# Patient Record
Sex: Male | Born: 2000 | Race: White | Hispanic: Yes | Marital: Single | Smoking: Never smoker
Health system: Southern US, Community
[De-identification: ages and names within clinical notes are randomized; demographics above are authoritative.]

## PROBLEM LIST (undated history)

## (undated) DIAGNOSIS — Z87898 Personal history of other specified conditions: Secondary | ICD-10-CM

## (undated) DIAGNOSIS — K5900 Constipation, unspecified: Secondary | ICD-10-CM

## (undated) DIAGNOSIS — F909 Attention-deficit hyperactivity disorder, unspecified type: Secondary | ICD-10-CM

## (undated) DIAGNOSIS — E669 Obesity, unspecified: Secondary | ICD-10-CM

## (undated) DIAGNOSIS — E781 Pure hyperglyceridemia: Secondary | ICD-10-CM

## (undated) DIAGNOSIS — R159 Full incontinence of feces: Secondary | ICD-10-CM

## (undated) HISTORY — PX: ADENOIDECTOMY: SUR15

## (undated) HISTORY — PX: TYMPANOSTOMY TUBE PLACEMENT: SHX32

## (undated) HISTORY — PX: UPPER GI ENDOSCOPY: SHX6162

## (undated) HISTORY — DX: Full incontinence of feces: R15.9

## (undated) HISTORY — PX: TONSILLECTOMY AND ADENOIDECTOMY: SUR1326

---
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
## 2023-12-27 ENCOUNTER — Ambulatory Visit (INDEPENDENT_AMBULATORY_CARE_PROVIDER_SITE_OTHER): Payer: MEDICAID | Admitting: Nurse Practitioner

## 2023-12-27 ENCOUNTER — Encounter: Payer: Self-pay | Admitting: Nurse Practitioner

## 2023-12-27 ENCOUNTER — Other Ambulatory Visit (HOSPITAL_COMMUNITY): Payer: Self-pay

## 2023-12-27 VITALS — BP 126/84 | HR 97 | Temp 98.4°F | Ht 67.5 in | Wt 256.5 lb

## 2023-12-27 DIAGNOSIS — Z1329 Encounter for screening for other suspected endocrine disorder: Secondary | ICD-10-CM | POA: Diagnosis not present

## 2023-12-27 DIAGNOSIS — F88 Other disorders of psychological development: Secondary | ICD-10-CM

## 2023-12-27 DIAGNOSIS — E669 Obesity, unspecified: Secondary | ICD-10-CM | POA: Diagnosis not present

## 2023-12-27 DIAGNOSIS — Z131 Encounter for screening for diabetes mellitus: Secondary | ICD-10-CM | POA: Diagnosis not present

## 2023-12-27 DIAGNOSIS — Z8639 Personal history of other endocrine, nutritional and metabolic disease: Secondary | ICD-10-CM | POA: Diagnosis not present

## 2023-12-27 DIAGNOSIS — Z13220 Encounter for screening for lipoid disorders: Secondary | ICD-10-CM

## 2023-12-27 DIAGNOSIS — F819 Developmental disorder of scholastic skills, unspecified: Secondary | ICD-10-CM | POA: Diagnosis not present

## 2023-12-27 DIAGNOSIS — Z0000 Encounter for general adult medical examination without abnormal findings: Secondary | ICD-10-CM | POA: Diagnosis not present

## 2023-12-27 DIAGNOSIS — R0989 Other specified symptoms and signs involving the circulatory and respiratory systems: Secondary | ICD-10-CM

## 2023-12-27 DIAGNOSIS — F411 Generalized anxiety disorder: Secondary | ICD-10-CM

## 2023-12-27 LAB — CBC
HCT: 47.1 % (ref 39.0–52.0)
Hemoglobin: 16.2 g/dL (ref 13.0–17.0)
MCHC: 34.3 g/dL (ref 30.0–36.0)
MCV: 89.8 fl (ref 78.0–100.0)
Platelets: 397 10*3/uL (ref 150.0–400.0)
RBC: 5.25 Mil/uL (ref 4.22–5.81)
RDW: 13.3 % (ref 11.5–15.5)
WBC: 9.1 10*3/uL (ref 4.0–10.5)

## 2023-12-27 LAB — COMPREHENSIVE METABOLIC PANEL WITH GFR
ALT: 14 U/L (ref 0–53)
AST: 15 U/L (ref 0–37)
Albumin: 4.9 g/dL (ref 3.5–5.2)
Alkaline Phosphatase: 42 U/L (ref 39–117)
BUN: 11 mg/dL (ref 6–23)
CO2: 27 meq/L (ref 19–32)
Calcium: 9.3 mg/dL (ref 8.4–10.5)
Chloride: 104 meq/L (ref 96–112)
Creatinine, Ser: 0.91 mg/dL (ref 0.40–1.50)
GFR: 120.52 mL/min (ref >60.00)
Glucose, Bld: 98 mg/dL (ref 70–99)
Potassium: 3.9 meq/L (ref 3.5–5.1)
Sodium: 141 meq/L (ref 135–145)
Total Bilirubin: 1.1 mg/dL (ref 0.2–1.2)
Total Protein: 6.9 g/dL (ref 6.0–8.3)

## 2023-12-27 LAB — LIPID PANEL
Cholesterol: 178 mg/dL (ref 0–200)
HDL: 39.3 mg/dL (ref >39.00)
LDL Cholesterol: 110 mg/dL — ABNORMAL HIGH (ref 0–99)
NonHDL: 138.61
Total CHOL/HDL Ratio: 5
Triglycerides: 141 mg/dL (ref 0.0–149.0)
VLDL: 28.2 mg/dL (ref 0.0–40.0)

## 2023-12-27 LAB — TSH: TSH: 6.47 u[IU]/mL — ABNORMAL HIGH (ref 0.35–5.50)

## 2023-12-27 LAB — VITAMIN D 25 HYDROXY (VIT D DEFICIENCY, FRACTURES): VITD: 9.38 ng/mL — ABNORMAL LOW (ref 30.00–100.00)

## 2023-12-27 LAB — HEMOGLOBIN A1C: Hgb A1c MFr Bld: 5.2 % (ref 4.6–6.5)

## 2023-12-27 MED ORDER — OMEPRAZOLE 20 MG PO CPDR
20.0000 mg | DELAYED_RELEASE_CAPSULE | Freq: Every day | ORAL | 3 refills | Status: DC
  Filled 2023-12-27: qty 30, 30d supply, fill #0
  Filled 2024-02-04: qty 30, 30d supply, fill #1
  Filled 2024-03-17: qty 30, 30d supply, fill #2
  Filled 2024-04-27: qty 30, 30d supply, fill #3

## 2023-12-27 NOTE — Progress Notes (Signed)
 New Patient Office Visit  Subjective    Patient ID: [name] Smith, male    DOB: 2001-08-05  Age: 23 y.o. MRN: [id number]  CC:  Chief Complaint  Patient presents with   New Patient (Initial Visit)    Requests CPE. Pt accompanied by mom, [name] Smith, and requests vit D be checked. Thinks pt may need to restart rx 50,000 U wkly.     HPI [name] Smith presents to establish care  for complete physical and follow up of chronic conditions.   Migraine: no aura with it. States that it is in the front part or either side. He will not have any light or sound sensitivity. He will have nausea. He will have them 1-2 times a week.  Patient has no official diagnosis of migraine conceptions as he has headaches in his mother has a history of the same  Clearing throat: Patient's mother and patient states that patient will clear her throat quite often mostly in the morning.  Is not correlated with after meals or lying flat.  Patient denies any kind of symptoms such as runny nose, congestion, sneezing.  He has not tried anything for this at this juncture  Immunizations: -Tetanus: Completed in 2016 -Influenza: out of season  -Shingles: too young -Pneumonia: too young   Diet: Fair diet. He eats once a day and that is dinner. He will snakc sometimes wen he gets hungry.he does drink coffee water and sometimes tea or soda.  Exercise: No regular exercise.  Eye exam: PRN does not have corrective lenses Dental exam: needs updating    Colonoscopy: too young currently average risk  Lung Cancer Screening: NA  PSA: too young currently average risk   Sleep: states that he stuggles with sleep. He has trouble sleeping. He is up at night and then will sleep during the day. Does have a history of OSA with a CPAP.    History of SI: he was seen by [facility] and he no longer has those thoughts. That was during covid. States that he was in counseling.    Outpatient Encounter Medications as of 12/27/2023  Medication Sig    omeprazole (PRILOSEC) 20 MG capsule Take 1 capsule (20 mg total) by mouth daily.   [DISCONTINUED] acetaminophen  (TYLENOL ) 500 MG tablet Take 500-1,000 mg by mouth every 6 (six) hours as needed for headache.   [DISCONTINUED] ergocalciferol  (VITAMIN D2) 1.25 MG (50000 UT) capsule Take 1 capsule (50,000 Units total) by mouth once a week.   [DISCONTINUED] HYDROcodone -acetaminophen  (NORCO/VICODIN) 5-325 MG tablet Take 1-2 tablets by mouth every 6 (six) hours as needed.   No facility-administered encounter medications on file as of 12/27/2023.    Past Medical History:  Diagnosis Date   Constipation    Encopresis    H/O febrile seizure    Hypertriglyceridemia    Obesity     Past Surgical History:  Procedure Laterality Date   ADENOIDECTOMY     TONSILLECTOMY AND ADENOIDECTOMY     TYMPANOSTOMY TUBE PLACEMENT     UPPER GI ENDOSCOPY      Family History  Problem Relation Age of Onset   Migraines Mother    Obesity Mother    Learning disabilities Father    Obesity Maternal Uncle    Hypertension Maternal Uncle    Hypertension Maternal Grandmother    Breast cancer Maternal Grandmother    Alcohol abuse Maternal Grandfather    Alzheimer's disease Paternal Grandmother    Stroke Paternal Grandfather    Skin cancer Paternal Grandfather  Throat cancer Paternal Grandfather    Hypertension Paternal Grandfather    Diabetes type II Paternal Grandfather     Social History   Socioeconomic History   Marital status: Single    Spouse name: Not on file   Number of children: Not on file   Years of education: Not on file   Highest education level: 12th grade  Occupational History   Not on file  Tobacco Use   Smoking status: Never   Smokeless tobacco: Never  Vaping Use   Vaping status: Never Used  Substance and Sexual Activity   Alcohol use: Not Currently   Drug use: No   Sexual activity: Never  Other Topics Concern   Not on file  Social History Narrative   Lives with mom and older  brother.    He is in 11th grade at [employer].    He enjoys being in theatre, reading and writing.    Social Drivers of Health   Financial Resource Strain: High Risk (12/26/2023)   Overall Financial Resource Strain (CARDIA)    Difficulty of Paying Living Expenses: Very hard  Food Insecurity: Patient Declined (12/26/2023)   Hunger Vital Sign    Worried About Running Out of Food in the Last Year: Patient declined    Ran Out of Food in the Last Year: Patient declined  Transportation Needs: Unknown (12/26/2023)   PRAPARE - Administrator, Civil Service (Medical): No    Lack of Transportation (Non-Medical): Patient declined  Physical Activity: Unknown (12/26/2023)   Exercise Vital Sign    Days of Exercise per Week: 0 days    Minutes of Exercise per Session: Not on file  Stress: No Stress Concern Present (12/26/2023)   [employer] of Occupational Health - Occupational Stress Questionnaire    Feeling of Stress : Only a little  Social Connections: Socially Isolated (12/26/2023)   Social Connection and Isolation Panel [NHANES]    Frequency of Communication with Friends and Family: Once a week    Frequency of Social Gatherings with Friends and Family: Once a week    Attends Religious Services: Never    Database administrator or Organizations: No    Attends Engineer, structural: Not on file    Marital Status: Never married  Intimate Partner Violence: Not on file    Review of Systems  Constitutional:  Negative for chills and fever.  Respiratory:  Negative for shortness of breath.   Cardiovascular:  Negative for chest pain and leg swelling.  Gastrointestinal:  Negative for abdominal pain, blood in stool, constipation, diarrhea, nausea and vomiting.       BM daily to every other day  Genitourinary:  Negative for dysuria and hematuria.  Neurological:  Positive for headaches. Negative for dizziness and tingling.  Psychiatric/Behavioral:  Negative for  hallucinations and suicidal ideas.         Objective    BP 126/84   Pulse 97   Temp 98.4 F (36.9 C) (Oral)   Ht 5' 7.5" (1.715 m)   Wt 256 lb 8 oz (116.3 kg)   SpO2 98%   BMI 39.58 kg/m   Physical Exam Vitals and nursing note reviewed.  Constitutional:      Appearance: Normal appearance. He is obese.  HENT:     Right Ear: Tympanic membrane, ear canal and external ear normal.     Left Ear: Tympanic membrane, ear canal and external ear normal.     Mouth/Throat:  Mouth: Mucous membranes are moist.     Pharynx: Oropharynx is clear.  Eyes:     Extraocular Movements: Extraocular movements intact.     Pupils: Pupils are equal, round, and reactive to light.  Cardiovascular:     Rate and Rhythm: Normal rate and regular rhythm.     Pulses: Normal pulses.     Heart sounds: Normal heart sounds.  Pulmonary:     Effort: Pulmonary effort is normal.     Breath sounds: Normal breath sounds.  Abdominal:     General: Bowel sounds are normal. There is no distension.     Palpations: There is no mass.     Tenderness: There is no abdominal tenderness.     Hernia: No hernia is present.  Musculoskeletal:     Right lower leg: No edema.     Left lower leg: No edema.  Lymphadenopathy:     Cervical: No cervical adenopathy.  Skin:    General: Skin is warm.  Neurological:     General: No focal deficit present.     Mental Status: He is alert.     Deep Tendon Reflexes:     Reflex Scores:      Bicep reflexes are 2+ on the right side and 2+ on the left side.      Patellar reflexes are 2+ on the right side and 2+ on the left side.    Comments: Bilateral upper and lower extremity strength 5/5  Psychiatric:        Mood and Affect: Mood normal.        Behavior: Behavior normal.        Thought Content: Thought content normal.        Judgment: Judgment normal.         Assessment & Plan:   Problem List Items Addressed This Visit       Other   Learning disability   Per mother's  report patient was evaluated early in life but unsure of official diagnosis or results of the testing      Delayed social skills   Patient seems to be doing well      Obesity (BMI 30-39.9)   History of same pending TSH, A1c, lipid panel.  Did review and discussed medical recommendation of exercise 5 times a week 30 minutes a time.  We will set a small goal of doing 15 minutes a day 3 times a week      Preventative health care - Primary   Discussed age-appropriate immunizations and screening exams.  Did review patient's personal, surgical, social, family histories.  Patient is up-to-date on all age-appropriate vaccinations he would like.  Patient is too young for CRC screening or prostate cancer screening.  Patient was given information at discharge about preventative healthcare maintenance with anticipatory guidance.  We did set small goals for patient inclusive of getting his learner's permit and start driving with apparent over the next year and exercise goals of walking 15 minutes daily for 3 times a week.      Relevant Orders   CBC   Comprehensive metabolic panel with GFR   TSH   History of vitamin D  deficiency   Pending vitamin D  level today      Relevant Orders   VITAMIN D  25 Hydroxy (Vit-D Deficiency, Fractures)   Throat clearing   Suspect this is related to a silent reflux will trial patient on omeprazole 20 mg daily for the next 30 days.  If no benefit consider topical  steroid and antihistamine      Relevant Medications   omeprazole (PRILOSEC) 20 MG capsule   GAD (generalized anxiety disorder)   Patient does seem to have some anxiety and fear with going out and doing things.  He used to be in counseling previously that seemed to be beneficial.  Patient was lost to follow-up due to becoming uninsured now that he has insurance patient is willing to do therapy again referral placed discussed with patient and patient's mother that patient might benefit from psychiatry referral  but will let psychology assess him first      Relevant Orders   Ambulatory referral to Psychology   Other Visit Diagnoses       Screening for diabetes mellitus       Relevant Orders   Hemoglobin A1c     Screening for thyroid  disorder       Relevant Orders   TSH     Screening for lipid disorders       Relevant Orders   Lipid panel       Return in about 1 year (around 12/26/2024) for CPE and Labs.   Margarie Shay, NP

## 2023-12-27 NOTE — Assessment & Plan Note (Signed)
 History of same pending TSH, A1c, lipid panel.  Did review and discussed medical recommendation of exercise 5 times a week 30 minutes a time.  We will set a small goal of doing 15 minutes a day 3 times a week

## 2023-12-27 NOTE — Assessment & Plan Note (Signed)
 Discussed age-appropriate immunizations and screening exams.  Did review patient's personal, surgical, social, family histories.  Patient is up-to-date on all age-appropriate vaccinations he would like.  Patient is too young for CRC screening or prostate cancer screening.  Patient was given information at discharge about preventative healthcare maintenance with anticipatory guidance.  We did set small goals for patient inclusive of getting his learner's permit and start driving with apparent over the next year and exercise goals of walking 15 minutes daily for 3 times a week.

## 2023-12-27 NOTE — Patient Instructions (Signed)
 Nice to see you today I will be in touch with the labs once I have reviewed them  Follow up with me in 1 year, sooner If you need me   Goals are to get your learners permit to start driving Another goal is to get out and walk 15 mins a day 3 days a week

## 2023-12-27 NOTE — Assessment & Plan Note (Signed)
 Pending vitamin D level today.

## 2023-12-27 NOTE — Assessment & Plan Note (Signed)
 Per mother's report patient was evaluated early in life but unsure of official diagnosis or results of the testing

## 2023-12-27 NOTE — Assessment & Plan Note (Signed)
 Patient seems to be doing well

## 2023-12-27 NOTE — Assessment & Plan Note (Signed)
 Patient does seem to have some anxiety and fear with going out and doing things.  He used to be in counseling previously that seemed to be beneficial.  Patient was lost to follow-up due to becoming uninsured now that he has insurance patient is willing to do therapy again referral placed discussed with patient and patient's mother that patient might benefit from psychiatry referral but will let psychology assess him first

## 2023-12-27 NOTE — Assessment & Plan Note (Signed)
 Suspect this is related to a silent reflux will trial patient on omeprazole 20 mg daily for the next 30 days.  If no benefit consider topical steroid and antihistamine

## 2023-12-30 ENCOUNTER — Telehealth: Payer: Self-pay | Admitting: *Deleted

## 2023-12-30 NOTE — Telephone Encounter (Signed)
 Copied from CRM [id number]. Topic: Clinical - Lab/Test Results >> Dec 30, 2023  3:28 PM Deaijah H wrote: Reason for CRM: Patients mom would like to know what next steps would be due to some labs being out of normal range. Please call [phone]

## 2023-12-31 ENCOUNTER — Other Ambulatory Visit: Payer: Self-pay | Admitting: Nurse Practitioner

## 2023-12-31 ENCOUNTER — Ambulatory Visit: Payer: Self-pay | Admitting: Nurse Practitioner

## 2023-12-31 ENCOUNTER — Other Ambulatory Visit (HOSPITAL_COMMUNITY): Payer: Self-pay

## 2023-12-31 DIAGNOSIS — R7989 Other specified abnormal findings of blood chemistry: Secondary | ICD-10-CM

## 2023-12-31 DIAGNOSIS — E559 Vitamin D deficiency, unspecified: Secondary | ICD-10-CM

## 2023-12-31 DIAGNOSIS — E7800 Pure hypercholesterolemia, unspecified: Secondary | ICD-10-CM

## 2023-12-31 MED ORDER — VITAMIN D (ERGOCALCIFEROL) 1.25 MG (50000 UNIT) PO CAPS
50000.0000 [IU] | ORAL_CAPSULE | ORAL | 0 refills | Status: DC
  Filled 2023-12-31 – 2024-02-04 (×2): qty 12, 84d supply, fill #0

## 2023-12-31 NOTE — Telephone Encounter (Signed)
 Relayed lab results with [name] Smith and scheduled lab appt for pt. See other encounter

## 2024-01-07 ENCOUNTER — Other Ambulatory Visit (HOSPITAL_COMMUNITY): Payer: Self-pay

## 2024-01-10 ENCOUNTER — Other Ambulatory Visit (HOSPITAL_COMMUNITY): Payer: Self-pay

## 2024-01-10 ENCOUNTER — Telehealth: Payer: Self-pay | Admitting: Nurse Practitioner

## 2024-01-10 NOTE — Telephone Encounter (Signed)
 Left detailed voicemail for patient to call the office back.

## 2024-01-10 NOTE — Telephone Encounter (Signed)
-----   Message from [facility] sent at 12/27/2023 11:00 AM EDT ----- Regarding: throat clearing/OSA See if omeprazole  has helped and if we have gotten records in regards to patients OSA

## 2024-01-10 NOTE — Telephone Encounter (Signed)
 See if omeprazole  has helped and if we have gotten records in regards to patients OSA

## 2024-01-14 NOTE — Telephone Encounter (Signed)
 Left voicemail for patient to call the office back.

## 2024-01-14 NOTE — Telephone Encounter (Signed)
 Pt called back. Spoke with patients mother Ifeanyichukwu Wickham on [place].    [name] states that patient does not notice any difference on omeprazole .  No update on records of OSA.  [name] states she will reach out to [facility] and let us  know when we need to place an order/prescription for the medical supplies.  [name] also mentions that she will pick up Vitamin D  script and have pt start today.  No further questions or concerns.

## 2024-02-04 ENCOUNTER — Other Ambulatory Visit (HOSPITAL_COMMUNITY): Payer: Self-pay

## 2024-02-07 ENCOUNTER — Other Ambulatory Visit: Payer: MEDICAID

## 2024-02-07 DIAGNOSIS — R7989 Other specified abnormal findings of blood chemistry: Secondary | ICD-10-CM | POA: Diagnosis not present

## 2024-02-07 LAB — TSH: TSH: 2.56 u[IU]/mL (ref 0.35–5.50)

## 2024-02-07 LAB — T3, FREE: T3, Free: 4 pg/mL (ref 2.3–4.2)

## 2024-02-07 LAB — T4, FREE: Free T4: 0.78 ng/dL (ref 0.60–1.60)

## 2024-02-08 ENCOUNTER — Other Ambulatory Visit (HOSPITAL_COMMUNITY): Payer: Self-pay

## 2024-02-11 ENCOUNTER — Ambulatory Visit: Payer: Self-pay | Admitting: Nurse Practitioner

## 2024-03-17 ENCOUNTER — Other Ambulatory Visit: Payer: Self-pay | Admitting: Nurse Practitioner

## 2024-03-17 ENCOUNTER — Other Ambulatory Visit (HOSPITAL_COMMUNITY): Payer: Self-pay

## 2024-03-17 DIAGNOSIS — E559 Vitamin D deficiency, unspecified: Secondary | ICD-10-CM

## 2024-03-17 MED ORDER — VITAMIN D (ERGOCALCIFEROL) 1.25 MG (50000 UNIT) PO CAPS
50000.0000 [IU] | ORAL_CAPSULE | ORAL | 0 refills | Status: AC
  Filled 2024-03-17 – 2024-04-27 (×2): qty 12, 84d supply, fill #0

## 2024-04-27 ENCOUNTER — Other Ambulatory Visit (HOSPITAL_COMMUNITY): Payer: Self-pay

## 2024-06-29 ENCOUNTER — Other Ambulatory Visit: Payer: Self-pay | Admitting: Nurse Practitioner

## 2024-06-29 ENCOUNTER — Other Ambulatory Visit (HOSPITAL_COMMUNITY): Payer: Self-pay

## 2024-06-29 DIAGNOSIS — E559 Vitamin D deficiency, unspecified: Secondary | ICD-10-CM

## 2024-06-29 NOTE — Telephone Encounter (Signed)
 Can we get patinet in for a vitamin D  level recheck along with the Thyroid  recheck. Lab visit is fine

## 2024-06-30 ENCOUNTER — Other Ambulatory Visit (HOSPITAL_COMMUNITY): Payer: Self-pay

## 2024-07-01 ENCOUNTER — Other Ambulatory Visit: Payer: MEDICAID

## 2024-07-01 ENCOUNTER — Other Ambulatory Visit: Payer: Self-pay | Admitting: Nurse Practitioner

## 2024-07-01 DIAGNOSIS — E559 Vitamin D deficiency, unspecified: Secondary | ICD-10-CM

## 2024-07-03 ENCOUNTER — Other Ambulatory Visit (INDEPENDENT_AMBULATORY_CARE_PROVIDER_SITE_OTHER): Payer: MEDICAID

## 2024-07-03 ENCOUNTER — Other Ambulatory Visit: Payer: Self-pay | Admitting: Nurse Practitioner

## 2024-07-03 DIAGNOSIS — E559 Vitamin D deficiency, unspecified: Secondary | ICD-10-CM | POA: Diagnosis not present

## 2024-07-03 DIAGNOSIS — R7989 Other specified abnormal findings of blood chemistry: Secondary | ICD-10-CM

## 2024-07-03 NOTE — Addendum Note (Signed)
 Addended by: ISADORA RAISIN on: 07/03/2024 02:39 PM   Modules accepted: Orders

## 2024-07-04 LAB — TSH: TSH: 5.47 m[IU]/L — ABNORMAL HIGH (ref 0.40–4.50)

## 2024-07-04 LAB — VITAMIN D 25 HYDROXY (VIT D DEFICIENCY, FRACTURES): Vit D, 25-Hydroxy: 28 ng/mL — ABNORMAL LOW (ref 30–100)

## 2024-07-07 ENCOUNTER — Ambulatory Visit: Payer: Self-pay | Admitting: Nurse Practitioner

## 2024-07-07 DIAGNOSIS — Z8669 Personal history of other diseases of the nervous system and sense organs: Secondary | ICD-10-CM

## 2024-07-22 NOTE — Addendum Note (Signed)
 Addended by: WENDEE LYNWOOD HERO on: 07/22/2024 12:46 PM   Modules accepted: Orders

## 2024-07-24 ENCOUNTER — Other Ambulatory Visit: Payer: Self-pay | Admitting: Nurse Practitioner

## 2024-07-24 ENCOUNTER — Other Ambulatory Visit (HOSPITAL_COMMUNITY): Payer: Self-pay

## 2024-07-24 DIAGNOSIS — R0989 Other specified symptoms and signs involving the circulatory and respiratory systems: Secondary | ICD-10-CM

## 2024-07-28 ENCOUNTER — Other Ambulatory Visit (HOSPITAL_COMMUNITY): Payer: Self-pay

## 2024-07-28 ENCOUNTER — Telehealth: Payer: Self-pay

## 2024-07-28 MED ORDER — OMEPRAZOLE 20 MG PO CPDR
20.0000 mg | DELAYED_RELEASE_CAPSULE | Freq: Every day | ORAL | 3 refills | Status: AC
  Filled 2024-07-28: qty 30, 30d supply, fill #0

## 2024-07-28 NOTE — Telephone Encounter (Signed)
 Called and spoke with mother on dpr.  Informed her that prescription has been reordered.

## 2024-07-28 NOTE — Telephone Encounter (Signed)
 Copied from CRM #[id number]. Topic: Clinical - Prescription Issue >> Jul 28, 2024 12:26 PM Alexandria E wrote: Reason for CRM: Patient's mother, Dedra, called in stating they they tried re-ordering the patient's prescription of omeprazole  (PRILOSEC) 20 MG capsule on 12/5 and the pharmacy is stating they never received that refill. Patient's mother was hoping to pick it up today. Please reach out to mom to clarify.

## 2024-07-28 NOTE — Telephone Encounter (Signed)
I re ordered it today

## 2024-08-09 ENCOUNTER — Telehealth: Payer: MEDICAID | Admitting: Physician Assistant

## 2024-08-09 DIAGNOSIS — R6889 Other general symptoms and signs: Secondary | ICD-10-CM

## 2024-08-09 MED ORDER — ALBUTEROL SULFATE HFA 108 (90 BASE) MCG/ACT IN AERS: 2.0000 | INHALATION_SPRAY | Freq: Four times a day (QID) | RESPIRATORY_TRACT | 0 refills | Status: AC | PRN

## 2024-08-09 MED ORDER — BENZONATATE 100 MG PO CAPS: 100.0000 mg | ORAL_CAPSULE | Freq: Two times a day (BID) | ORAL | 0 refills | Status: AC | PRN

## 2024-08-09 MED ORDER — ONDANSETRON 4 MG PO TBDP: 4.0000 mg | ORAL_TABLET | Freq: Three times a day (TID) | ORAL | 0 refills | Status: AC | PRN

## 2024-08-09 MED ORDER — OSELTAMIVIR PHOSPHATE 75 MG PO CAPS: 75.0000 mg | ORAL_CAPSULE | Freq: Two times a day (BID) | ORAL | 0 refills | Status: AC

## 2024-08-09 NOTE — Patient Instructions (Signed)
 " Marolyn Benders, thank you for joining [name], PA-C for today's virtual visit.  While this provider is not your primary care provider (PCP), if your PCP is located in our provider database this encounter information will be shared with them immediately following your visit.   A [facility] MyChart account gives you access to today's visit and all your visits, tests, and labs performed at [facility]  click here if you don't have a [facility] MyChart account or go to [email or web address]  Consent: (Patient) Marolyn Benders provided verbal consent for this virtual visit at the beginning of the encounter.  Current Medications:  Current Outpatient Medications:    omeprazole  (PRILOSEC) 20 MG capsule, Take 1 capsule (20 mg total) by mouth daily., Disp: 30 capsule, Rfl: 3   Vitamin D , Ergocalciferol , (DRISDOL ) 1.25 MG (50000 UNIT) CAPS capsule, Take 1 capsule by mouth every 7 (seven) days., Disp: 12 capsule, Rfl: 0   Medications ordered in this encounter:  No orders of the defined types were placed in this encounter.    *If you need refills on other medications prior to your next appointment, please contact your pharmacy*  Follow-Up: Call back or seek an in-person evaluation if the symptoms worsen or if the condition fails to improve as anticipated.  [facility] [phone]  Other Instructions Please keep well-hydrated and try to get plenty of rest. If you have a humidifier, place it in the bedroom and run it at night. Start a saline nasal rinse for nasal congestion. You can consider use of a nasal steroid spray like Flonase or Nasacort OTC. You can alternate between Tylenol  and Ibuprofen if needed for fever, body aches, headache and/or throat pain. Salt water-gargles and chloraseptic spray can be very beneficial for sore throat. Mucinex-DM for congestion or cough. Please take all prescribed medications as directed.  Remain out of work until  [employer] for 24 hours without a fever-reducing medication, and you are feeling better.  You should mask until symptoms are resolved.  If anything worsens despite treatment, you need to be evaluated in-person. Please do not delay care.  Influenza, Adult Influenza is also called the flu. It is an infection in the lungs, nose, and throat (respiratory tract). It spreads easily from person to person (is contagious). The flu causes symptoms that are like a cold, along with high fever and body aches. What are the causes? This condition is caused by the influenza virus. You can get the virus by: Breathing in droplets that are in the air after a person infected with the flu coughed or sneezed. Touching something that has the virus on it and then touching your mouth, nose, or eyes. What increases the risk? Certain things may make you more likely to get the flu. These include: Not washing your hands often. Having close contact with many people during cold and flu season. Touching your mouth, eyes, or nose without first washing your hands. Not getting a flu shot every year. You may have a higher risk for the flu, and serious problems, such as a lung infection (pneumonia), if you: Are older than 65. Are pregnant. Have a weakened disease-fighting system (immune system) because of a disease or because you are taking certain medicines. Have a long-term (chronic) condition, such as: Heart, kidney, or lung disease. Diabetes. Asthma. Have a liver disorder. Are very overweight (morbidly obese). Have anemia. What are the signs or symptoms? Symptoms usually begin suddenly and last 4-14 days. They may include: Fever and  chills. Headaches, body aches, or muscle aches. Sore throat. Cough. Runny or stuffy (congested) nose. Feeling discomfort in your chest. Not wanting to eat as much as normal. Feeling weak or tired. Feeling dizzy. Feeling sick to your stomach or throwing up. How is this treated? If  the flu is found early, you can be treated with antiviral medicine. This can help to reduce how bad the illness is and how long it lasts. This may be given by mouth or through an IV tube. Taking care of yourself at home can help your symptoms get better. Your doctor may want you to: Take over-the-counter medicines. Drink plenty of fluids. The flu often goes away on its own. If you have very bad symptoms or other problems, you may be treated in a hospital. Follow these instructions at home:     Activity Rest as needed. Get plenty of sleep. Stay home from work or school as told by your doctor. Do not leave home until you do not have a fever for 24 hours without taking medicine. Leave home only to go to your doctor. Eating and drinking Take an ORS (oral rehydration solution). This is a drink that is sold at pharmacies and stores. Drink enough fluid to keep your pee pale yellow. Drink clear fluids in small amounts as you are able. Clear fluids include: Water. Ice chips. Fruit juice mixed with water. Low-calorie sports drinks. Eat bland foods that are easy to digest. Eat small amounts as you are able. These foods include: Bananas. Applesauce. Rice. Lean meats. Toast. Crackers. Do not eat or drink: Fluids that have a lot of sugar or caffeine. Alcohol. Spicy or fatty foods. General instructions Take over-the-counter and prescription medicines only as told by your doctor. Use a cool mist humidifier to add moisture to the air in your home. This can make it easier for you to breathe. When using a cool mist humidifier, clean it daily. Empty water and replace with clean water. Cover your mouth and nose when you cough or sneeze. Wash your hands with soap and water often and for at least 20 seconds. This is also important after you cough or sneeze. If you cannot use soap and water, use alcohol-based hand sanitizer. Keep all follow-up visits. How is this prevented?  Get a flu shot every  year. You may get the flu shot in late summer, fall, or winter. Ask your doctor when you should get your flu shot. Avoid contact with people who are sick during fall and winter. This is cold and flu season. Contact a doctor if: You get new symptoms. You have: Chest pain. Watery poop (diarrhea). A fever. Your cough gets worse. You start to have more mucus. You feel sick to your stomach. You throw up. Get help right away if you: Have shortness of breath. Have trouble breathing. Have skin or nails that turn a bluish color. Have very bad pain or stiffness in your neck. Get a sudden headache. Get sudden pain in your face or ear. Cannot eat or drink without throwing up. These symptoms may represent a serious problem that is an emergency. Get medical help right away. Call your local emergency services (911 in the [place] Do not wait to see if the symptoms will go away. Do not drive yourself to the hospital. Summary Influenza is also called the flu. It is an infection in the lungs, nose, and throat. It spreads easily from person to person. Take over-the-counter and prescription medicines only as told by your doctor.  Getting a flu shot every year is the best way to not get the flu. This information is not intended to replace advice given to you by your health care provider. Make sure you discuss any questions you have with your health care provider. Document Revised: 03/25/2020 Document Reviewed: 03/25/2020 Elsevier Patient Education  2023 Elsevier Inc.      If you have been instructed to have an in-person evaluation today at a local Urgent Care facility, please use the link below. It will take you to a list of all of our available [facility], including address, phone number and hours of operation. Please do not delay care.  [facility]  If you or a family member do not have a primary care provider, use the link below to schedule a visit and establish care.  When you choose a [facility] primary care physician or advanced practice provider, you gain a long-term partner in health. Find a Primary Care Provider  Learn more about [facility]'s in-office and virtual care options: [facility] - Get Care Now  "
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
## 2024-08-19 ENCOUNTER — Other Ambulatory Visit (HOSPITAL_COMMUNITY): Payer: Self-pay
